# Patient Record
Sex: Male | Born: 2000 | Race: White | Hispanic: No | Marital: Single | Smoking: Never smoker
Health system: Southern US, Community
[De-identification: ages and names within clinical notes are randomized; demographics above are authoritative.]

## PROBLEM LIST (undated history)

## (undated) DIAGNOSIS — F909 Attention-deficit hyperactivity disorder, unspecified type: Secondary | ICD-10-CM

## (undated) DIAGNOSIS — R51 Headache: Secondary | ICD-10-CM

## (undated) DIAGNOSIS — J189 Pneumonia, unspecified organism: Secondary | ICD-10-CM

## (undated) DIAGNOSIS — L309 Dermatitis, unspecified: Secondary | ICD-10-CM

## (undated) DIAGNOSIS — J45909 Unspecified asthma, uncomplicated: Secondary | ICD-10-CM

## (undated) DIAGNOSIS — B974 Respiratory syncytial virus as the cause of diseases classified elsewhere: Secondary | ICD-10-CM

## (undated) DIAGNOSIS — D573 Sickle-cell trait: Secondary | ICD-10-CM

## (undated) DIAGNOSIS — R519 Headache, unspecified: Secondary | ICD-10-CM

## (undated) HISTORY — DX: Respiratory syncytial virus as the cause of diseases classified elsewhere: B97.4

## (undated) HISTORY — PX: TONSILLECTOMY: SUR1361

## (undated) HISTORY — PX: FINGER FRACTURE SURGERY: SHX638

---
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
## 2019-03-23 DIAGNOSIS — J45909 Unspecified asthma, uncomplicated: Secondary | ICD-10-CM | POA: Diagnosis not present

## 2019-03-23 DIAGNOSIS — J069 Acute upper respiratory infection, unspecified: Secondary | ICD-10-CM | POA: Diagnosis not present

## 2019-11-02 ENCOUNTER — Encounter (HOSPITAL_COMMUNITY): Payer: Self-pay

## 2019-11-02 ENCOUNTER — Ambulatory Visit (HOSPITAL_COMMUNITY)
Admission: EM | Admit: 2019-11-02 | Discharge: 2019-11-02 | Disposition: A | Discharge status: Home / Self Care | Payer: Medicaid Other | Attending: Internal Medicine | Admitting: Internal Medicine

## 2019-11-02 ENCOUNTER — Other Ambulatory Visit: Payer: Self-pay

## 2019-11-02 DIAGNOSIS — G43719 Chronic migraine without aura, intractable, without status migrainosus: Secondary | ICD-10-CM

## 2019-11-02 MED ORDER — IBUPROFEN 800 MG PO TABS: 800.0000 mg | ORAL_TABLET | Freq: Three times a day (TID) | ORAL | 0 refills | Status: DC

## 2019-11-02 NOTE — ED Triage Notes (Signed)
Patient presents to Urgent Care with complaints of headache since a week ago. Patient reports he has been taking advil for his pain.

## 2019-11-02 NOTE — Discharge Instructions (Addendum)
Push fluid Make a headache diary Follow up with PCP Return to clinic if symptom get worse

## 2019-11-02 NOTE — ED Provider Notes (Signed)
[facility]    CSN: [id number] Arrival date & time: 11/02/19  1238      History   Chief Complaint Chief Complaint  Patient presents with  . Headache    HPI Louis Bell is a 18 y.o. male.   The history is provided by the patient. No language interpreter was used.  Headache Pain location:  Generalized Quality: thrombing. Radiates to:  L neck and R neck (back of the neck) Severity currently:  5/10 Severity at highest:  7/10 Onset quality:  Gradual Duration:  1 week Timing:  Constant Progression:  Worsening Chronicity:  Chronic Similar to prior headaches: no   Context: not activity, not caffeine and not coughing   Relieved by:  NSAIDs Worsened by:  Sound and light Associated symptoms: no congestion, no cough, no diarrhea, no dizziness, no fatigue, no fever, no numbness, no sinus pressure, no sore throat, no URI and no weakness     Past Medical History:  Diagnosis Date  . ADHD (attention deficit hyperactivity disorder)    not on medication  . Asthma   . Eczema   . Headache   . Pneumonia   . RSV infection 2001  . Sickle cell trait [facility])     Patient Active Problem List   Diagnosis Date Noted  . Migraine without aura 01/16/2013  . Tension headache 01/16/2013  . Attention deficit disorder with hyperactivity(314.01) 01/16/2013    Past Surgical History:  Procedure Laterality Date  . ESOPHAGOGASTRODUODENOSCOPY N/A 02/05/2017   Procedure: ESOPHAGOGASTRODUODENOSCOPY (EGD);  Surgeon: Adelene Amas, MD;  Location: [facility] ENDOSCOPY;  Service: Gastroenterology;  Laterality: N/A;  . FINGER FRACTURE SURGERY     5th  . TONSILLECTOMY    . TONSILLECTOMY AND ADENOIDECTOMY  2011  . TYMPANOSTOMY  2011       Home Medications    Prior to Admission medications   Medication Sig Start Date End Date Taking? Authorizing Provider  dexmethylphenidate (FOCALIN XR) 15 MG 24 hr capsule Take 15 mg by mouth daily.    [provider]  ibuprofen (ADVIL) 800 MG  tablet Take 1 tablet (800 mg total) by mouth 3 (three) times daily. 11/02/19   Anyia Gierke, Zachery Dakins, FNP  lansoprazole (PREVACID) 30 MG capsule Take 1 capsule (30 mg total) by mouth daily. Patient not taking: Reported on 10/18/2017 01/23/17   Viviano Simas, NP  Magnesium Oxide 500 MG TABS Take by mouth.    [provider]  riboflavin (VITAMIN B-2) 100 MG TABS tablet Take 100 mg by mouth daily.    [provider]  sucralfate (CARAFATE) 1 g tablet Take 1 tablet (1 g total) by mouth 4 (four) times daily -  with meals and at bedtime. Patient not taking: Reported on 10/18/2017 01/23/17   Viviano Simas, NP    Family History Family History  Problem Relation Age of Onset  . Migraines Mother   . Depression Mother   . Hypertension Mother   . Alcohol abuse Father   . Hypertension Father   . Cancer Maternal Grandmother   . Migraines Sister   . Migraines Brother     Social History Social History   Tobacco Use  . Smoking status: Never Smoker  . Smokeless tobacco: Never Used  Substance Use Topics  . Alcohol use: No  . Drug use: No     Allergies   Patient has no known allergies.   Review of Systems Review of Systems  Constitutional: Negative for activity change, appetite change, chills, fatigue and fever.  HENT: Negative for congestion, sinus pressure, sinus pain and sore throat.   Respiratory: Negative for cough, chest tightness and shortness of breath.   Cardiovascular: Negative for chest pain and leg swelling.  Gastrointestinal: Negative for diarrhea.  Neurological: Positive for headaches. Negative for dizziness, tremors, facial asymmetry, speech difficulty, weakness, light-headedness and numbness.     Physical Exam Triage Vital Signs ED Triage Vitals  Enc Vitals Group     BP 11/02/19 1317 (!) 148/78     Pulse Rate 11/02/19 1317 79     Resp 11/02/19 1317 16     Temp 11/02/19 1317 98.1 F (36.7 C)     Temp Source 11/02/19 1317 Temporal     SpO2 11/02/19  1317 100 %     Weight --      Height --      Head Circumference --      Peak Flow --      Pain Score 11/02/19 1315 5     Pain Loc --      Pain Edu? --      Excl. in [place]? --    No data found.  Updated Vital Signs BP (!) 148/78 (BP Location: Right Arm)   Pulse 79   Temp 98.1 F (36.7 C) (Temporal)   Resp 16   SpO2 100%   Visual Acuity Right Eye Distance:   Left Eye Distance:   Bilateral Distance:    Right Eye Near:   Left Eye Near:    Bilateral Near:     Physical Exam Constitutional:      General: He is not in acute distress.    Appearance: He is well-developed and normal weight. He is not ill-appearing or toxic-appearing.  HENT:     Head: Normocephalic.  Cardiovascular:     Rate and Rhythm: Normal rate and regular rhythm.     Heart sounds: Normal heart sounds. No murmur.  Pulmonary:     Effort: Pulmonary effort is normal. No respiratory distress.     Breath sounds: Normal breath sounds. No stridor. No wheezing.  Neurological:     Mental Status: He is alert and oriented to person, place, and time.     Cranial Nerves: Cranial nerves are intact.     Sensory: Sensation is intact.     Motor: Motor function is intact.     Coordination: Coordination normal. Finger-Nose-Finger Test and Heel to Logan Test normal.     Gait: Gait is intact.      UC Treatments / Results  Labs (all labs ordered are listed, but only abnormal results are displayed) Labs Reviewed - No data to display  EKG   Radiology No results found.  Procedures Procedures (including critical care time)  Medications Ordered in UC Medications - No data to display  Initial Impression / Assessment and Plan / UC Course  I have reviewed the triage vital signs and the nursing notes.  Pertinent labs & imaging results that were available during my care of the patient were reviewed by me and considered in my medical decision making (see chart for details).   Ibuprofen 800 mg was prescribed. Patient was  advised to increase fluid intake and to follow up with PCP Final Clinical Impressions(s) / UC Diagnoses   Final diagnoses:  Intractable chronic migraine without aura and without status migrainosus     Discharge Instructions     Push fluid Make a headache diary Follow up with PCP Return to clinic if symptom get worse  ED Prescriptions    Medication Sig Dispense Auth. Provider   ibuprofen (ADVIL) 800 MG tablet Take 1 tablet (800 mg total) by mouth 3 (three) times daily. 42 tablet Townsend Cudworth, Zachery DakinsKomlanvi S, FNP     PDMP not reviewed this encounter.   Durward Parcelvegno, Sharla Tankard S, FNP 11/02/19 1353

## 2020-02-11 ENCOUNTER — Ambulatory Visit
Admission: EM | Admit: 2020-02-11 | Discharge: 2020-02-11 | Disposition: A | Discharge status: Home / Self Care | Payer: Medicaid Other | Attending: Physician Assistant | Admitting: Physician Assistant

## 2020-02-11 DIAGNOSIS — H18892 Other specified disorders of cornea, left eye: Secondary | ICD-10-CM

## 2020-02-11 MED ORDER — SYSTANE 0.4-0.3 % OP GEL: 1.0000 "application " | Freq: Every evening | OPHTHALMIC | 0 refills | Status: DC | PRN

## 2020-02-11 MED ORDER — OFLOXACIN 0.3 % OP SOLN: 1.0000 [drp] | Freq: Four times a day (QID) | OPHTHALMIC | 0 refills | Status: AC

## 2020-02-11 NOTE — ED Triage Notes (Signed)
Pt c/o lt eye irritation and redness after pressure washing brick/morder yesterday.

## 2020-02-11 NOTE — ED Provider Notes (Signed)
[name]    CSN: [id number] Arrival date & time: 02/11/20  1246      History   Chief Complaint Chief Complaint  Patient presents with  . Eye Pain    HPI Louis Bell is a 19 y.o. male.   19 year old male comes in for left eye irritation. He felt foreign body sensation to both eyes after washing brick/morder with pressure washing yesterday. He rinsed with tap water after and went to bed. Right eye symptoms has completely resolved. Left eye foreign body sensation has resolved, but now with eye irritation, redness. Had crusting in the morning. Denies vision changes. Mild photophobia. Denies contact lens/glasses use.      Past Medical History:  Diagnosis Date  . ADHD (attention deficit hyperactivity disorder)    not on medication  . Asthma   . Eczema   . Headache   . Pneumonia   . RSV infection 2001  . Sickle cell trait [facility])     Patient Active Problem List   Diagnosis Date Noted  . Migraine without aura 01/16/2013  . Tension headache 01/16/2013  . Attention deficit disorder with hyperactivity(314.01) 01/16/2013    Past Surgical History:  Procedure Laterality Date  . ESOPHAGOGASTRODUODENOSCOPY N/A 02/05/2017   Procedure: ESOPHAGOGASTRODUODENOSCOPY (EGD);  Surgeon: Adelene Amas, MD;  Location: [facility] ENDOSCOPY;  Service: Gastroenterology;  Laterality: N/A;  . FINGER FRACTURE SURGERY     5th  . TONSILLECTOMY    . TONSILLECTOMY AND ADENOIDECTOMY  2011  . TYMPANOSTOMY  2011       Home Medications    Prior to Admission medications   Medication Sig Start Date End Date Taking? Authorizing Provider  ofloxacin (OCUFLOX) 0.3 % ophthalmic solution Place 1 drop into the left eye 4 (four) times daily for 7 days. 02/11/20 02/18/20  Belinda Fisher, PA-C  Polyethyl Glycol-Propyl Glycol (SYSTANE) 0.4-0.3 % GEL ophthalmic gel Place 1 application into both eyes at bedtime as needed. 02/11/20   Belinda Fisher, PA-C    Family History Family History  Problem Relation Age of  Onset  . Migraines Mother   . Depression Mother   . Hypertension Mother   . Alcohol abuse Father   . Hypertension Father   . Cancer Maternal Grandmother   . Migraines Sister   . Migraines Brother     Social History Social History   Tobacco Use  . Smoking status: Never Smoker  . Smokeless tobacco: Never Used  Substance Use Topics  . Alcohol use: No  . Drug use: No     Allergies   Patient has no known allergies.   Review of Systems Review of Systems  Reason unable to perform ROS: See HPI as above.     Physical Exam Triage Vital Signs ED Triage Vitals  Enc Vitals Group     BP 02/11/20 1255 129/85     Pulse Rate 02/11/20 1255 86     Resp 02/11/20 1255 16     Temp 02/11/20 1255 98.3 F (36.8 C)     Temp Source 02/11/20 1255 Oral     SpO2 02/11/20 1255 98 %     Weight --      Height --      Head Circumference --      Peak Flow --      Pain Score 02/11/20 1256 2     Pain Loc --      Pain Edu? --      Excl. in [place]? --  No data found.  Updated Vital Signs BP 129/85 (BP Location: Left Arm)   Pulse 86   Temp 98.3 F (36.8 C) (Oral)   Resp 16   SpO2 98%   Visual Acuity Right Eye Distance: 2025 Left Eye Distance: 20/25 Bilateral Distance: 20/20  Right Eye Near:   Left Eye Near:    Bilateral Near:     Physical Exam Constitutional:      General: He is not in acute distress.    Appearance: He is well-developed.  HENT:     Head: Normocephalic and atraumatic.  Eyes:     General: Lids are normal. Lids are everted, no foreign bodies appreciated.     Extraocular Movements: Extraocular movements intact.     Pupils: Pupils are equal, round, and reactive to light.     Comments: No photophobia on exam.  Mild conjunctival injection to the lateral left eye.  No obvious defect/foreign body noted.  Fluorescein stain without uptake.  Musculoskeletal:     Cervical back: Normal range of motion and neck supple.  Skin:    General: Skin is warm and dry.   Neurological:     Mental Status: He is alert and oriented to person, place, and time.      UC Treatments / Results  Labs (all labs ordered are listed, but only abnormal results are displayed) Labs Reviewed - No data to display  EKG   Radiology No results found.  Procedures Procedures (including critical care time)  Medications Ordered in UC Medications - No data to display  Initial Impression / Assessment and Plan / UC Course  I have reviewed the triage vital signs and the nursing notes.  Pertinent labs & imaging results that were available during my care of the patient were reviewed by me and considered in my medical decision making (see chart for details).    Discussed irritation most likely from tapwater use during irrigation.  At this time, will start artificial tears for symptomatic management.  Rx of ofloxacin called into pharmacy, if patient continues to have symptoms, worsening conjunctival injection, crusting, can start ofloxacin as directed.  Return precautions given.  Patient expresses understanding and agrees to plan.  Final Clinical Impressions(s) / UC Diagnoses   Final diagnoses:  Corneal irritation of left eye   ED Prescriptions    Medication Sig Dispense Auth. Provider   Polyethyl Glycol-Propyl Glycol (SYSTANE) 0.4-0.3 % GEL ophthalmic gel Place 1 application into both eyes at bedtime as needed. 10 mL Dezra Mandella V, PA-C   ofloxacin (OCUFLOX) 0.3 % ophthalmic solution Place 1 drop into the left eye 4 (four) times daily for 7 days. 1.4 mL Ok Edwards, PA-C     PDMP not reviewed this encounter.   Ok Edwards, PA-C 02/11/20 1323

## 2020-02-11 NOTE — Discharge Instructions (Signed)
Start artificial tear gel once at night, more often if needed. If symptoms resolves in 2-3 days, do not have to fill the antibiotic eye drop. However, if starting to have more crusting to the eye, or continued symptoms after 3 day use of artificial tear, start ofloxacin as directed. Monitor for any worsening of symptoms, changes in vision, sensitivity to light, eye swelling, painful eye movement, follow up with ophthalmology for further evaluation.

## 2020-03-11 ENCOUNTER — Ambulatory Visit
Admission: EM | Admit: 2020-03-11 | Discharge: 2020-03-11 | Disposition: A | Discharge status: Home / Self Care | Payer: Medicaid Other

## 2020-07-01 ENCOUNTER — Other Ambulatory Visit: Payer: Self-pay

## 2020-07-01 ENCOUNTER — Ambulatory Visit
Admission: EM | Admit: 2020-07-01 | Discharge: 2020-07-01 | Disposition: A | Discharge status: Home / Self Care | Payer: Medicaid Other | Attending: Emergency Medicine | Admitting: Emergency Medicine

## 2020-07-01 DIAGNOSIS — K219 Gastro-esophageal reflux disease without esophagitis: Secondary | ICD-10-CM

## 2020-07-01 DIAGNOSIS — R1030 Lower abdominal pain, unspecified: Secondary | ICD-10-CM

## 2020-07-01 MED ORDER — NAPROXEN 500 MG PO TABS: 500.0000 mg | ORAL_TABLET | Freq: Two times a day (BID) | ORAL | 0 refills | Status: DC

## 2020-07-01 MED ORDER — PANTOPRAZOLE SODIUM 40 MG PO TBEC: 40.0000 mg | DELAYED_RELEASE_TABLET | Freq: Every day | ORAL | 0 refills | Status: DC

## 2020-07-01 NOTE — Discharge Instructions (Addendum)
Important to keep a log of your symptoms: When they start, what alleviates them, pain on a scale of 1-10. Bring your symptom log to your primary care for further evaluation. Take protonix once daily.

## 2020-07-01 NOTE — ED Triage Notes (Signed)
Pt c/o low mid abdominal pain x 2 weeks, last BM on Monday, states he normally has a BM daily. Has history of abdominal pain with abdominal migraine diagnosis, states this feels different. Pt states pain is 4/10 but "when I work it's like an 8."

## 2020-07-01 NOTE — ED Provider Notes (Signed)
[name]    CSN: [id number] Arrival date & time: 07/01/20  1454      History   Chief Complaint Chief Complaint  Patient presents with  . 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  Patient denies urinary frequency, urgency, burning sensation, hematuria, abdominal pain, back pain, fever, penile pain, swelling, irritation, testicular pain, swelling.  Last bowel movement yesterday: Micah Flesher twice which is more than normal.  Denies watery, fatty stools, blood or melena.  Does endorse history of abdominal migraine: No headache, visual changes, photophobia.  States abdominal migraines in the past have not had headaches.  States abdominal pain is mild currently.  Does admit to increased belching with some heartburn: Has not taken any for this.  Past Medical History:  Diagnosis Date  . ADHD (attention deficit hyperactivity disorder)    not on medication  . Asthma   . Eczema   . Headache   . Pneumonia   . RSV infection 2001  . Sickle cell trait [facility])     Patient Active Problem List   Diagnosis Date Noted  . Migraine without aura 01/16/2013  . Tension headache 01/16/2013  . Attention deficit disorder with hyperactivity(314.01) 01/16/2013    Past Surgical History:  Procedure Laterality Date  . ESOPHAGOGASTRODUODENOSCOPY N/A 02/05/2017   Procedure: ESOPHAGOGASTRODUODENOSCOPY (EGD);  Surgeon: Adelene Amas, MD;  Location: [facility] ENDOSCOPY;  Service: Gastroenterology;  Laterality: N/A;  . FINGER FRACTURE SURGERY     5th  . TONSILLECTOMY    . TONSILLECTOMY AND ADENOIDECTOMY  2011  . TYMPANOSTOMY  2011       Home Medications    Prior to Admission medications   Medication Sig Start Date End Date Taking? Authorizing Provider  naproxen (NAPROSYN) 500 MG tablet Take 1 tablet (500 mg total) by mouth 2 (two) times daily. 07/01/20   Hall-Potvin, [place], PA-C  pantoprazole (PROTONIX) 40 MG tablet  Take 1 tablet (40 mg total) by mouth daily. 07/01/20   Hall-Potvin, [place], PA-C    Family History Family History  Problem Relation Age of Onset  . Migraines Mother   . Depression Mother   . Hypertension Mother   . Alcohol abuse Father   . Hypertension Father   . Cancer Maternal Grandmother   . Migraines Sister   . Migraines Brother     Social History Social History   Tobacco Use  . Smoking status: Never Smoker  . Smokeless tobacco: Never Used  Vaping Use  . Vaping Use: Never used  Substance Use Topics  . Alcohol use: No  . Drug use: No     Allergies   Patient has no known allergies.   Review of Systems As per HPI   Physical Exam Triage Vital Signs ED Triage Vitals  Enc Vitals Group     BP      Pulse      Resp      Temp      Temp src      SpO2      Weight      Height      Head Circumference      Peak Flow      Pain Score      Pain Loc      Pain Edu?      Excl. in [place]?    No data found.  Updated Vital Signs BP (!) 150/83   Pulse 83  Temp 98.3 F (36.8 C)   Resp 18   SpO2 98%   Visual Acuity Right Eye Distance:   Left Eye Distance:   Bilateral Distance:    Right Eye Near:   Left Eye Near:    Bilateral Near:     Physical Exam Constitutional:      General: He is not in acute distress.    Appearance: He is well-developed. He is obese. He is not ill-appearing.  HENT:     Head: Normocephalic and atraumatic.     Mouth/Throat:     Pharynx: Oropharynx is clear.  Eyes:     General: No scleral icterus.    Pupils: Pupils are equal, round, and reactive to light.  Cardiovascular:     Rate and Rhythm: Normal rate.  Pulmonary:     Effort: Pulmonary effort is normal. No respiratory distress.     Breath sounds: No wheezing.  Abdominal:     General: Bowel sounds are normal. There is no distension or abdominal bruit.     Palpations: Abdomen is soft. There is no hepatomegaly or splenomegaly.     Tenderness: There is no abdominal tenderness.  There is no right CVA tenderness or left CVA tenderness. Negative signs include Murphy's sign, Rovsing's sign and McBurney's sign.     Hernia: No hernia is present.  Skin:    Capillary Refill: Capillary refill takes less than 2 seconds.     Coloration: Skin is not cyanotic, jaundiced or pale.     Findings: No rash.  Neurological:     Mental Status: He is alert and oriented to person, place, and time.      UC Treatments / Results  Labs (all labs ordered are listed, but only abnormal results are displayed) Labs Reviewed - No data to display  EKG   Radiology No results found.  Procedures Procedures (including critical care time)  Medications Ordered in UC Medications - No data to display  Initial Impression / Assessment and Plan / UC Course  I have reviewed the triage vital signs and the nursing notes.  Pertinent labs & imaging results that were available during my care of the patient were reviewed by me and considered in my medical decision making (see chart for details).     Patient appears well in office today.  Work note provided at patient's request.  Will start medication for GERD, monitor symptoms closely, and follow-up with PCP for further evaluation and management.  Discussed that GI referral may be needed in the future or neurology if these are truly abdominal migraines.  No in office treatment as patient is largely asymptomatic.  Return precautions discussed, pt verbalized understanding and is agreeable to plan. Final Clinical Impressions(s) / UC Diagnoses   Final diagnoses:  Lower abdominal pain  Gastroesophageal reflux disease, unspecified whether esophagitis present     Discharge Instructions     Important to keep a log of your symptoms: When they start, what alleviates them, pain on a scale of 1-10. Bring your symptom log to your primary care for further evaluation. Take protonix once daily.    ED Prescriptions    Medication Sig Dispense Auth.  Provider   naproxen (NAPROSYN) 500 MG tablet Take 1 tablet (500 mg total) by mouth 2 (two) times daily. 30 tablet Hall-Potvin, [place], PA-C   pantoprazole (PROTONIX) 40 MG tablet Take 1 tablet (40 mg total) by mouth daily. 30 tablet Hall-Potvin, [place], PA-C     PDMP not reviewed this encounter.   Hall-Potvin,  [place], PA-C 07/01/20 1528

## 2020-10-17 ENCOUNTER — Ambulatory Visit
Admission: EM | Admit: 2020-10-17 | Discharge: 2020-10-17 | Disposition: A | Discharge status: Home / Self Care | Payer: Medicaid Other | Attending: Emergency Medicine | Admitting: Emergency Medicine

## 2020-10-17 ENCOUNTER — Encounter: Payer: Self-pay | Admitting: Emergency Medicine

## 2020-10-17 ENCOUNTER — Other Ambulatory Visit: Payer: Self-pay

## 2020-10-17 DIAGNOSIS — R1084 Generalized abdominal pain: Secondary | ICD-10-CM | POA: Diagnosis not present

## 2020-10-17 MED ORDER — NAPROXEN 500 MG PO TABS: 500.0000 mg | ORAL_TABLET | Freq: Two times a day (BID) | ORAL | 0 refills | Status: DC

## 2020-10-17 MED ORDER — PANTOPRAZOLE SODIUM 40 MG PO TBEC: 40.0000 mg | DELAYED_RELEASE_TABLET | Freq: Every day | ORAL | 0 refills | Status: DC

## 2020-10-17 NOTE — ED Triage Notes (Signed)
Pt states he had abdominal pain on Wednesday with an emesis episode and it resolved and returned today. Pt states no nausea or emesis today but pain/cramps. Pt is aox4 and ambulatory.

## 2020-10-17 NOTE — Discharge Instructions (Addendum)
Take reflux medication once daily. Please take time to review food suggestion packet attached. Important follow-up with primary care/GI for further evaluation and management. Go to ER for vomiting, black stools, blood in stools, severe abdominal pain, fever. 

## 2020-10-17 NOTE — ED Provider Notes (Signed)
[name]    CSN: [id number] Arrival date & time: 10/17/20  1709      History   Chief Complaint Chief Complaint  Patient presents with  . Abdominal Pain    wednesday and then again today    HPI Louis Bell is a 19 y.o. male  Presenting for generalized abdominal pain since Wednesday.  Was seen for this by me on 07/01/2020: Please see those records attached to patient's file.  Was given PPI, NSAID with resolution.  States has been out of PPI for the last few months.  Denies increased use of NSAIDs, nausea, vomiting, fever.  States "it feels just like the last time ".  Requesting work note.   Past Medical History:  Diagnosis Date  . ADHD (attention deficit hyperactivity disorder)    not on medication  . Asthma   . Eczema   . Headache   . Pneumonia   . RSV infection 2001  . Sickle cell trait [facility])     Patient Active Problem List   Diagnosis Date Noted  . Migraine without aura 01/16/2013  . Tension headache 01/16/2013  . Attention deficit disorder with hyperactivity(314.01) 01/16/2013    Past Surgical History:  Procedure Laterality Date  . ESOPHAGOGASTRODUODENOSCOPY N/A 02/05/2017   Procedure: ESOPHAGOGASTRODUODENOSCOPY (EGD);  Surgeon: Adelene Amas, MD;  Location: [facility] ENDOSCOPY;  Service: Gastroenterology;  Laterality: N/A;  . FINGER FRACTURE SURGERY     5th  . TONSILLECTOMY    . TONSILLECTOMY AND ADENOIDECTOMY  2011  . TYMPANOSTOMY  2011       Home Medications    Prior to Admission medications   Medication Sig Start Date End Date Taking? Authorizing Provider  naproxen (NAPROSYN) 500 MG tablet Take 1 tablet (500 mg total) by mouth 2 (two) times daily. 10/17/20   Hall-Potvin, [place], PA-C  pantoprazole (PROTONIX) 40 MG tablet Take 1 tablet (40 mg total) by mouth daily. 10/17/20   Hall-Potvin, [place], PA-C    Family History Family History  Problem Relation Age of Onset  . Migraines Mother   . Depression Mother   . Hypertension Mother    . Alcohol abuse Father   . Hypertension Father   . Cancer Maternal Grandmother   . Migraines Sister   . Migraines Brother     Social History Social History   Tobacco Use  . Smoking status: Never Smoker  . Smokeless tobacco: Never Used  Vaping Use  . Vaping Use: Never used  Substance Use Topics  . Alcohol use: No  . Drug use: No     Allergies   Patient has no known allergies.   Review of Systems Review of Systems  Constitutional: Negative for fatigue and fever.  Respiratory: Negative for cough and shortness of breath.   Cardiovascular: Negative for chest pain and palpitations.  Gastrointestinal: Positive for abdominal pain. Negative for abdominal distention, blood in stool, constipation, diarrhea, nausea and vomiting.  Genitourinary: Negative for discharge and dysuria.  Musculoskeletal: Negative for arthralgias and myalgias.  Skin: Negative for rash and wound.  Neurological: Negative for speech difficulty and headaches.  All other systems reviewed and are negative.    Physical Exam Triage Vital Signs ED Triage Vitals [10/17/20 1823]  Enc Vitals Group     BP 135/89     Pulse Rate 75     Resp 20     Temp (!) 97.5 F (36.4 C)     Temp Source Oral     SpO2 98 %  Weight      Height      Head Circumference      Peak Flow      Pain Score      Pain Loc      Pain Edu?      Excl. in [place]?    No data found.  Updated Vital Signs BP 135/89 (BP Location: Right Arm)   Pulse 75   Temp (!) 97.5 F (36.4 C) (Oral)   Resp 20   SpO2 98%   Visual Acuity Right Eye Distance:   Left Eye Distance:   Bilateral Distance:    Right Eye Near:   Left Eye Near:    Bilateral Near:     Physical Exam Constitutional:      General: He is not in acute distress. HENT:     Head: Normocephalic and atraumatic.  Eyes:     General: No scleral icterus.    Pupils: Pupils are equal, round, and reactive to light.  Cardiovascular:     Rate and Rhythm: Normal rate.  Pulmonary:      Effort: Pulmonary effort is normal. No respiratory distress.     Breath sounds: No wheezing.  Abdominal:     General: Abdomen is flat. Bowel sounds are normal. There is no distension or abdominal bruit.     Palpations: Abdomen is soft. There is no hepatomegaly or splenomegaly.     Tenderness: There is no abdominal tenderness. There is no guarding or rebound. Negative signs include Murphy's sign, Rovsing's sign and McBurney's sign.     Hernia: No hernia is present.  Skin:    Coloration: Skin is not jaundiced or pale.  Neurological:     Mental Status: He is alert and oriented to person, place, and time.      UC Treatments / Results  Labs (all labs ordered are listed, but only abnormal results are displayed) Labs Reviewed - No data to display  EKG   Radiology No results found.  Procedures Procedures (including critical care time)  Medications Ordered in UC Medications - No data to display  Initial Impression / Assessment and Plan / UC Course  I have reviewed the triage vital signs and the nursing notes.  Pertinent labs & imaging results that were available during my care of the patient were reviewed by me and considered in my medical decision making (see chart for details).     Exam benign.  Will treat pt as below.  Again encouraged pt to keep sx log and f/u w/ GI for persistent/recurrent sx as he did not since last time.  Work note provided.  Return precautions discussed, pt verbalized understanding and is agreeable to plan. Final Clinical Impressions(s) / UC Diagnoses   Final diagnoses:  Generalized abdominal pain     Discharge Instructions     Take reflux medication once daily. Please take time to review food suggestion packet attached. Important follow-up with primary care/GI for further evaluation and management. Go to ER for vomiting, black stools, blood in stools, severe abdominal pain, fever.    ED Prescriptions    Medication Sig Dispense Auth.  Provider   pantoprazole (PROTONIX) 40 MG tablet Take 1 tablet (40 mg total) by mouth daily. 60 tablet Hall-Potvin, [place], PA-C   naproxen (NAPROSYN) 500 MG tablet Take 1 tablet (500 mg total) by mouth 2 (two) times daily. 30 tablet Hall-Potvin, [place], PA-C     PDMP not reviewed this encounter.   Odette Fraction [place], [place] 10/17/20 1930

## 2022-10-23 ENCOUNTER — Ambulatory Visit
Admission: EM | Admit: 2022-10-23 | Discharge: 2022-10-23 | Disposition: A | Discharge status: Home / Self Care | Payer: 59 | Attending: Physician Assistant | Admitting: Physician Assistant

## 2022-10-23 DIAGNOSIS — J019 Acute sinusitis, unspecified: Secondary | ICD-10-CM | POA: Diagnosis not present

## 2022-10-23 DIAGNOSIS — H1033 Unspecified acute conjunctivitis, bilateral: Secondary | ICD-10-CM | POA: Diagnosis not present

## 2022-10-23 MED ORDER — AMOXICILLIN-POT CLAVULANATE 875-125 MG PO TABS: 1.0000 | ORAL_TABLET | Freq: Two times a day (BID) | ORAL | 0 refills | Status: DC

## 2022-10-23 MED ORDER — POLYMYXIN B-TRIMETHOPRIM 10000-0.1 UNIT/ML-% OP SOLN: 1.0000 [drp] | OPHTHALMIC | 0 refills | Status: AC

## 2022-10-23 NOTE — ED Triage Notes (Signed)
Pt presents to uc with co of eye swelling, drainage, nasal congestion for 2 weeks has been taking tylenol.

## 2022-10-23 NOTE — ED Provider Notes (Signed)
[name]    CSN: [id number] Arrival date & time: 10/23/22  1537      History   Chief Complaint Chief Complaint  Patient presents with   Nasal Congestion   Facial Swelling    HPI Louis Bell is a 21 y.o. male.   Patient here today for evaluation of bilateral eye irritation and swelling, nasal congestion and drainage, cough that started 2 weeks ago.  He states he has been taking Tylenol with mild relief.  He has not had fever.  He denies any vomiting or diarrhea.  He states he typically does not have allergies this time of year.  The history is provided by the patient.    Past Medical History:  Diagnosis Date   ADHD (attention deficit hyperactivity disorder)    not on medication   Asthma    Eczema    Headache    Pneumonia    RSV infection 2001   Sickle cell trait [facility])     Patient Active Problem List   Diagnosis Date Noted   Migraine without aura 01/16/2013   Tension headache 01/16/2013   Attention deficit disorder with hyperactivity(314.01) 01/16/2013    Past Surgical History:  Procedure Laterality Date   ESOPHAGOGASTRODUODENOSCOPY N/A 02/05/2017   Procedure: ESOPHAGOGASTRODUODENOSCOPY (EGD);  Surgeon: Joycelyn Rua, MD;  Location: [place];  Service: Gastroenterology;  Laterality: N/A;   FINGER FRACTURE SURGERY     5th   TONSILLECTOMY     TONSILLECTOMY AND ADENOIDECTOMY  2011   TYMPANOSTOMY  2011       Home Medications    Prior to Admission medications   Medication Sig Start Date End Date Taking? Authorizing Provider  amoxicillin-clavulanate (AUGMENTIN) 875-125 MG tablet Take 1 tablet by mouth every 12 (twelve) hours. 10/23/22  Yes Francene Finders, PA-C  trimethoprim-polymyxin b (POLYTRIM) ophthalmic solution Place 1 drop into both eyes every 4 (four) hours for 7 days. 10/23/22 10/30/22 Yes Francene Finders, PA-C  naproxen (NAPROSYN) 500 MG tablet Take 1 tablet (500 mg total) by mouth 2 (two) times daily. 10/17/20   Hall-Potvin,  [place], PA-C  pantoprazole (PROTONIX) 40 MG tablet Take 1 tablet (40 mg total) by mouth daily. 10/17/20   Hall-Potvin, [place], PA-C    Family History Family History  Problem Relation Age of Onset   Migraines Mother    Depression Mother    Hypertension Mother    Alcohol abuse Father    Hypertension Father    Cancer Maternal Grandmother    Migraines Sister    Migraines Brother     Social History Social History   Tobacco Use   Smoking status: Never   Smokeless tobacco: Never  Vaping Use   Vaping Use: Never used  Substance Use Topics   Alcohol use: No   Drug use: No     Allergies   Patient has no known allergies.   Review of Systems Review of Systems  Constitutional:  Negative for chills and fever.  HENT:  Positive for congestion. Negative for ear pain and sore throat.   Eyes:  Positive for redness. Negative for discharge.  Respiratory:  Positive for cough. Negative for shortness of breath.   Gastrointestinal:  Negative for abdominal pain, nausea and vomiting.     Physical Exam Triage Vital Signs ED Triage Vitals  Enc Vitals Group     BP      Pulse      Resp      Temp      Temp src  SpO2      Weight      Height      Head Circumference      Peak Flow      Pain Score      Pain Loc      Pain Edu?      Excl. in [place]?    No data found.  Updated Vital Signs BP 131/86   Pulse 84   Temp 97.8 F (36.6 C)   Resp (!) 98   SpO2 98%      Physical Exam Vitals and nursing note reviewed.  Constitutional:      General: He is not in acute distress.    Appearance: Normal appearance. He is not ill-appearing.  HENT:     Head: Normocephalic and atraumatic.     Nose: Congestion present.     Mouth/Throat:     Mouth: Mucous membranes are moist.     Pharynx: Oropharynx is clear. No oropharyngeal exudate or posterior oropharyngeal erythema.  Eyes:     Comments: Bilateral conjunctiva mildly injected  Cardiovascular:     Rate and Rhythm: Normal rate and  regular rhythm.     Heart sounds: Normal heart sounds. No murmur heard. Pulmonary:     Effort: Pulmonary effort is normal. No respiratory distress.     Breath sounds: Normal breath sounds. No wheezing, rhonchi or rales.  Skin:    General: Skin is warm and dry.  Neurological:     Mental Status: He is alert.  Psychiatric:        Mood and Affect: Mood normal.        Thought Content: Thought content normal.      UC Treatments / Results  Labs (all labs ordered are listed, but only abnormal results are displayed) Labs Reviewed - No data to display  EKG   Radiology No results found.  Procedures Procedures (including critical care time)  Medications Ordered in UC Medications - No data to display  Initial Impression / Assessment and Plan / UC Course  I have reviewed the triage vital signs and the nursing notes.  Pertinent labs & imaging results that were available during my care of the patient were reviewed by me and considered in my medical decision making (see chart for details).    Will treat to cover sinusitis with augmentin and conjunctivitis with polytrim drops. Encouraged follow up if no gradual improvement or with any further concerns.   Final Clinical Impressions(s) / UC Diagnoses   Final diagnoses:  Acute sinusitis, recurrence not specified, unspecified location  Acute conjunctivitis of both eyes, unspecified acute conjunctivitis type   Discharge Instructions   None    ED Prescriptions     Medication Sig Dispense Auth. Provider   amoxicillin-clavulanate (AUGMENTIN) 875-125 MG tablet Take 1 tablet by mouth every 12 (twelve) hours. 14 tablet Ewell Poe F, PA-C   trimethoprim-polymyxin b (POLYTRIM) ophthalmic solution Place 1 drop into both eyes every 4 (four) hours for 7 days. 10 mL Francene Finders, PA-C      PDMP not reviewed this encounter.   Francene Finders, PA-C 10/23/22 1612

## 2022-12-06 ENCOUNTER — Other Ambulatory Visit: Payer: Self-pay

## 2022-12-06 ENCOUNTER — Ambulatory Visit
Admission: EM | Admit: 2022-12-06 | Discharge: 2022-12-06 | Disposition: A | Discharge status: Home / Self Care | Payer: 59 | Attending: Family Medicine | Admitting: Family Medicine

## 2022-12-06 ENCOUNTER — Encounter: Payer: Self-pay | Admitting: Emergency Medicine

## 2022-12-06 DIAGNOSIS — J4521 Mild intermittent asthma with (acute) exacerbation: Secondary | ICD-10-CM

## 2022-12-06 DIAGNOSIS — J069 Acute upper respiratory infection, unspecified: Secondary | ICD-10-CM

## 2022-12-06 DIAGNOSIS — Z20828 Contact with and (suspected) exposure to other viral communicable diseases: Secondary | ICD-10-CM

## 2022-12-06 MED ORDER — OSELTAMIVIR PHOSPHATE 75 MG PO CAPS: 75.0000 mg | ORAL_CAPSULE | Freq: Two times a day (BID) | ORAL | 0 refills | Status: AC

## 2022-12-06 MED ORDER — ALBUTEROL SULFATE HFA 108 (90 BASE) MCG/ACT IN AERS: 1.0000 | INHALATION_SPRAY | Freq: Four times a day (QID) | RESPIRATORY_TRACT | 1 refills | Status: DC | PRN

## 2022-12-06 NOTE — Discharge Instructions (Signed)
Caring for yourself: Get plenty of rest. Drink plenty of fluids, enough so that your urine is light yellow or clear like water. If you have kidney, heart, or liver disease and have to limit fluids, talk with your doctor before you increase the amount of fluids you drink. Take an over-the-counter pain medicine if needed, such as acetaminophen (Tylenol), ibuprofen (Advil, Motrin), or naproxen (Aleve), to relieve fever, headache, and muscle aches. Read and follow all instructions on the label. No one younger than 20 should take aspirin. It has been linked to Reye syndrome, a serious illness. Before you use over the counter cough and cold medicines, check the label. These medicines may not be safe for children younger than age 6 or for people with certain health problems. If the skin around your nose and lips becomes sore, put some petroleum jelly on the area.  Avoid spreading the flu: Wash your hands regularly, and keep your hands away from your face.  Stay home from school, work, and other public places until you are feeling better and your fever has been gone for at least 24 hours. The fever needs to have gone away on its own without the help of medicine.  

## 2022-12-06 NOTE — ED Triage Notes (Signed)
Pt here for cough and congestion x 2 days 

## 2022-12-06 NOTE — ED Provider Notes (Signed)
[facility]   [id number] 12/06/22 Arrival Time: 1647  ASSESSMENT & PLAN:  1. Viral URI with cough   2. Exposure to influenza   3. Mild intermittent asthma with acute exacerbation    No respiratory distress. Discussed typical duration of likely viral illness. Declines Rx cough medication. Work note provided. OTC symptom care as needed.  Meds ordered this encounter  Medications   oseltamivir (TAMIFLU) 75 MG capsule    Sig: Take 1 capsule (75 mg total) by mouth 2 (two) times daily for 5 days.    Dispense:  10 capsule    Refill:  0   albuterol (VENTOLIN HFA) 108 (90 Base) MCG/ACT inhaler    Sig: Inhale 1-2 puffs into the lungs every 6 (six) hours as needed for wheezing or shortness of breath.    Dispense:  1 each    Refill:  1       Follow-up Information     [facility] .   Specialty: Urgent Care Why: If worsening or failing to improve as anticipated. Contact information: [address] [id number] mc [place] [address] [phone]                Reviewed expectations re: course of current medical issues. Questions answered. Outlined signs and symptoms indicating need for more acute intervention. Understanding verbalized. After Visit Summary given.   SUBJECTIVE: History from: Patient. Louis Bell is a 21 y.o. male. Reports: cough and congestion x 2 days; abrupt onset; brother with + flu test this week. Denies: fever and difficulty breathing. Normal PO intake without n/v/d. Is wheezing at times; asthma history. No CP/SOB.  OBJECTIVE:  Vitals:   12/06/22 1852  BP: (!) 142/84  Pulse: 85  Resp: 18  Temp: 98.7 F (37.1 C)  TempSrc: Oral  SpO2: 97%    General appearance: alert; no distress Eyes: PERRLA; EOMI; conjunctiva normal HENT: [place]; AT; with nasal congestion Neck: supple  Lungs: speaks full sentences without difficulty; unlabored; mild bilat exp wheeze Extremities: no  edema Skin: warm and dry Neurologic: normal gait Psychological: alert and cooperative; normal mood and affect   No Known Allergies  Past Medical History:  Diagnosis Date   ADHD (attention deficit hyperactivity disorder)    not on medication   Asthma    Eczema    Headache    Pneumonia    RSV infection 2001   Sickle cell trait (HCC)    Social History   Socioeconomic History   Marital status: Single    Spouse name: Not on file   Number of children: Not on file   Years of education: Not on file   Highest education level: Not on file  Occupational History   Not on file  Tobacco Use   Smoking status: Never   Smokeless tobacco: Never  Vaping Use   Vaping Use: Never used  Substance and Sexual Activity   Alcohol use: No   Drug use: No   Sexual activity: Yes  Other Topics Concern   Not on file  Social History Narrative   Rayner is a 12th grade student, he attends [employer]. Lives with Mom only and has 5 siblings. He enjoys baseball, school, and hanging with his friends.   Social Determinants of Health   Financial Resource Strain: Not on file  Food Insecurity: Not on file  Transportation Needs: Not on file  Physical Activity: Not on file  Stress: Not on file  Social Connections:  Not on file  Intimate Partner Violence: Not on file   Family History  Problem Relation Age of Onset   Migraines Mother    Depression Mother    Hypertension Mother    Alcohol abuse Father    Hypertension Father    Cancer Maternal Grandmother    Migraines Sister    Migraines Brother    Past Surgical History:  Procedure Laterality Date   ESOPHAGOGASTRODUODENOSCOPY N/A 02/05/2017   Procedure: ESOPHAGOGASTRODUODENOSCOPY (EGD);  Surgeon: Adelene Amas, MD;  Location: [facility] ENDOSCOPY;  Service: Gastroenterology;  Laterality: N/A;   FINGER FRACTURE SURGERY     5th   TONSILLECTOMY     TONSILLECTOMY AND ADENOIDECTOMY  2011   TYMPANOSTOMY  2011     Mardella Layman, MD 12/06/22  Barry Brunner

## 2023-10-08 ENCOUNTER — Ambulatory Visit
Admission: RE | Admit: 2023-10-08 | Discharge: 2023-10-08 | Disposition: A | Discharge status: Home / Self Care | Payer: Self-pay | Source: Ambulatory Visit | Attending: Physician Assistant | Admitting: Physician Assistant

## 2023-10-08 VITALS — BP 129/93 | HR 102 | Temp 97.9°F | Resp 20 | Ht 71.0 in | Wt 260.0 lb

## 2023-10-08 DIAGNOSIS — J069 Acute upper respiratory infection, unspecified: Secondary | ICD-10-CM | POA: Insufficient documentation

## 2023-10-08 DIAGNOSIS — Z1152 Encounter for screening for COVID-19: Secondary | ICD-10-CM | POA: Insufficient documentation

## 2023-10-08 LAB — POCT RAPID STREP A (OFFICE): Rapid Strep A Screen: NEGATIVE

## 2023-10-08 NOTE — ED Triage Notes (Signed)
Patient presents with sore throat, congestion, runny nose and headache x day 3. Treated with Dayquil/ Nyquil and Ibuprofen, states cold medicine only an hour, then symptoms resumed.

## 2023-10-08 NOTE — ED Provider Notes (Signed)
[name]    CSN: [id number] Arrival date & time: 10/08/23  1444      History   Chief Complaint No chief complaint on file.   HPI Louis Bell is a 22 y.o. male.   Patient here today for evaluation of sore throat, congestion, runny nose and headache she has had for 3 days.  He has taken DayQuil NyQuil and ibuprofen without resolution.  He denies any vomiting or diarrhea.  The history is provided by the patient.    Past Medical History:  Diagnosis Date   ADHD (attention deficit hyperactivity disorder)    not on medication   Asthma    Eczema    Headache    Pneumonia    RSV infection 2001   Sickle cell trait [facility])     Patient Active Problem List   Diagnosis Date Noted   Migraine without aura 01/16/2013   Tension headache 01/16/2013   Attention deficit hyperactivity disorder (ADHD) 01/16/2013    Past Surgical History:  Procedure Laterality Date   ESOPHAGOGASTRODUODENOSCOPY N/A 02/05/2017   Procedure: ESOPHAGOGASTRODUODENOSCOPY (EGD);  Surgeon: Adelene Amas, MD;  Location: [facility] END[facility]COPY;  Service: Gastroenterology;  Laterality: N/A;   FINGER FRACTURE SURGERY     5th   TONSILLECTOMY     TONSILLECTOMY AND ADENOIDECTOMY  2011   TYMPANOSTOMY  2011       Home Medications    Prior to Admission medications   Medication Sig Start Date End Date Taking? Authorizing Provider  albuterol (VENTOLIN HFA) 108 (90 Base) MCG/ACT inhaler Inhale 1-2 puffs into the lungs every 6 (six) hours as needed for wheezing or shortness of breath. 12/06/22   Mardella Layman, MD  amoxicillin-clavulanate (AUGMENTIN) 875-125 MG tablet Take 1 tablet by mouth every 12 (twelve) hours. Patient not taking: Reported on 12/06/2022 10/23/22   Tomi Bamberger, PA-C  naproxen (NAPROSYN) 500 MG tablet Take 1 tablet (500 mg total) by mouth 2 (two) times daily. 10/17/20   Hall-Potvin, [place], PA-C  pantoprazole (PROTONIX) 40 MG tablet Take 1 tablet (40 mg total) by mouth daily. 10/17/20    Hall-Potvin, [place], PA-C    Family History Family History  Problem Relation Age of Onset   Migraines Mother    Depression Mother    Hypertension Mother    Alcohol abuse Father    Hypertension Father    Cancer Maternal Grandmother    Migraines Sister    Migraines Brother     Social History Social History   Tobacco Use   Smoking status: Never   Smokeless tobacco: Never  Vaping Use   Vaping status: Never Used  Substance Use Topics   Alcohol use: No   Drug use: No     Allergies   Patient has no known allergies.   Review of Systems Review of Systems  Constitutional:  Negative for chills and fever.  HENT:  Positive for congestion and sore throat. Negative for ear pain.   Eyes:  Negative for discharge and redness.  Respiratory:  Positive for cough. Negative for shortness of breath.   Gastrointestinal:  Negative for abdominal pain, diarrhea, nausea and vomiting.     Physical Exam Triage Vital Signs ED Triage Vitals  Encounter Vitals Group     BP 10/08/23 1526 (!) 129/93     Systolic BP Percentile --      Diastolic BP Percentile --      Pulse Rate 10/08/23 1526 (!) 102     Resp 10/08/23 1526 20  Temp 10/08/23 1526 97.9 F (36.6 C)     Temp Source 10/08/23 1526 Oral     SpO2 10/08/23 1526 98 %     Weight 10/08/23 1525 260 lb (117.9 kg)     Height 10/08/23 1525 5\' 11"  (1.803 m)     Head Circumference --      Peak Flow --      Pain Score 10/08/23 1525 4     Pain Loc --      Pain Education --      Exclude from Growth Chart --    No data found.  Updated Vital Signs BP (!) 129/93 (BP Location: Left Arm)   Pulse (!) 102   Temp 97.9 F (36.6 C) (Oral)   Resp 20   Ht 5\' 11"  (1.803 m)   Wt 260 lb (117.9 kg)   SpO2 98%   BMI 36.26 kg/m      Physical Exam Vitals and nursing note reviewed.  Constitutional:      General: He is not in acute distress.    Appearance: He is well-developed. He is not ill-appearing.  HENT:     Head: Normocephalic and  atraumatic.     Nose: Congestion present.     Mouth/Throat:     Mouth: Mucous membranes are moist.     Pharynx: Posterior oropharyngeal erythema present. No oropharyngeal exudate.     Tonsils: 0 on the right. 0 on the left.  Eyes:     Conjunctiva/sclera: Conjunctivae normal.  Cardiovascular:     Rate and Rhythm: Normal rate and regular rhythm.     Heart sounds: Normal heart sounds. No murmur heard. Pulmonary:     Effort: Pulmonary effort is normal. No respiratory distress.     Breath sounds: Normal breath sounds. No wheezing, rhonchi or rales.  Skin:    General: Skin is warm and dry.  Neurological:     Mental Status: He is alert.  Psychiatric:        Mood and Affect: Mood normal.        Behavior: Behavior normal.      UC Treatments / Results  Labs (all labs ordered are listed, but only abnormal results are displayed) Labs Reviewed  POCT RAPID STREP A (OFFICE) - Normal  SARS CORONAVIRUS 2 (TAT 6-24 HRS)    EKG   Radiology No results found.  Procedures Procedures (including critical care time)  Medications Ordered in UC Medications - No data to display  Initial Impression / Assessment and Plan / UC Course  I have reviewed the triage vital signs and the nursing notes.  Pertinent labs & imaging results that were available during my care of the patient were reviewed by me and considered in my medical decision making (see chart for details).   Rapid strep test negative.  Will order COVID screening I discussed suspected viral etiology of symptoms.  Recommended symptomatic treatment, increase fluids and rest with follow-up if no gradual improvement with any further concerns.   Final Clinical Impressions(s) / UC Diagnoses   Final diagnoses:  Acute upper respiratory infection   Discharge Instructions   None    ED Prescriptions   None    PDMP not reviewed this encounter.   Tomi Bamberger, PA-C 10/21/23 1048

## 2023-10-09 LAB — SARS CORONAVIRUS 2 (TAT 6-24 HRS): SARS Coronavirus 2: NEGATIVE

## 2024-01-20 ENCOUNTER — Ambulatory Visit
Admission: EM | Admit: 2024-01-20 | Discharge: 2024-01-20 | Disposition: A | Discharge status: Home / Self Care | Payer: BLUE CROSS/BLUE SHIELD

## 2024-01-20 DIAGNOSIS — K5900 Constipation, unspecified: Secondary | ICD-10-CM | POA: Diagnosis not present

## 2024-01-20 NOTE — ED Provider Notes (Signed)
[name]    CSN: [id number] Arrival date & time: 01/20/24  0950      History   Chief Complaint Chief Complaint  Patient presents with   Abdominal Pain   Emesis    HPI Louis Bell is a 23 y.o. male.   Patient complains of abdominal pain.  Patient reports he has not had a bowel movement in the past 4 days.  Patient reports that he has a history of constipation.  He has not taken any medication for constipation.  Patient states that he has not had a fever or chills.  Patient states that he has had abdominal migraines in the past with severe cramping.  Patient states that this pain is mild.  Patient denies any nausea or vomiting he denies any diarrhea.   Abdominal Pain Associated symptoms: vomiting   Emesis Associated symptoms: abdominal pain     Past Medical History:  Diagnosis Date   ADHD (attention deficit hyperactivity disorder)    not on medication   Asthma    Eczema    Headache    Pneumonia    RSV infection 09-Sep-2000   Sickle cell trait [facility])     Patient Active Problem List   Diagnosis Date Noted   Migraine without aura 01/16/2013   Tension headache 01/16/2013   Attention deficit hyperactivity disorder (ADHD) 01/16/2013    Past Surgical History:  Procedure Laterality Date   ESOPHAGOGASTRODUODENOSCOPY N/A 02/05/2017   Procedure: ESOPHAGOGASTRODUODENOSCOPY (EGD);  Surgeon: Adelene Amas, MD;  Location: [facility] ENDOSCOPY;  Service: Gastroenterology;  Laterality: N/A;   FINGER FRACTURE SURGERY     5th   TONSILLECTOMY     TONSILLECTOMY AND ADENOIDECTOMY  2011   TYMPANOSTOMY  2011       Home Medications    Prior to Admission medications   Not on File    Family History Family History  Problem Relation Age of Onset   Migraines Mother    Depression Mother    Hypertension Mother    Alcohol abuse Father    Hypertension Father    Cancer Maternal Grandmother    Migraines Sister    Migraines Brother     Social History Social History    Tobacco Use   Smoking status: Never   Smokeless tobacco: Never  Vaping Use   Vaping status: Some Days  Substance Use Topics   Alcohol use: No   Drug use: No     Allergies   Patient has no known allergies.   Review of Systems Review of Systems  Gastrointestinal:  Positive for abdominal pain and vomiting.  All other systems reviewed and are negative.    Physical Exam Triage Vital Signs ED Triage Vitals  Encounter Vitals Group     BP 01/20/24 1509 (!) 138/94     Systolic BP Percentile --      Diastolic BP Percentile --      Pulse Rate 01/20/24 1509 98     Resp 01/20/24 1509 18     Temp 01/20/24 1509 97.9 F (36.6 C)     Temp Source 01/20/24 1509 Oral     SpO2 01/20/24 1509 95 %     Weight --      Height --      Head Circumference --      Peak Flow --      Pain Score 01/20/24 1505 2     Pain Loc --      Pain Education --      Exclude  from Growth Chart --    No data found.  Updated Vital Signs BP (!) 138/94 (BP Location: Right Arm)   Pulse 98   Temp 97.9 F (36.6 C) (Oral)   Resp 18   SpO2 95%   Visual Acuity Right Eye Distance:   Left Eye Distance:   Bilateral Distance:    Right Eye Near:   Left Eye Near:    Bilateral Near:     Physical Exam Vitals and nursing note reviewed.  Constitutional:      Appearance: He is well-developed.  HENT:     Head: Normocephalic.  Cardiovascular:     Rate and Rhythm: Normal rate.  Pulmonary:     Effort: Pulmonary effort is normal.  Abdominal:     General: Abdomen is flat. Bowel sounds are normal. There is no distension.     Palpations: Abdomen is soft.     Tenderness: There is abdominal tenderness.  Musculoskeletal:        General: Normal range of motion.     Cervical back: Normal range of motion.  Skin:    General: Skin is warm.  Neurological:     General: No focal deficit present.     Mental Status: He is alert and oriented to person, place, and time.      UC Treatments / Results  Labs (all  labs ordered are listed, but only abnormal results are displayed) Labs Reviewed - No data to display  EKG   Radiology No results found.  Procedures Procedures (including critical care time)  Medications Ordered in UC Medications - No data to display  Initial Impression / Assessment and Plan / UC Course  I have reviewed the triage vital signs and the nursing notes.  Pertinent labs & imaging results that were available during my care of the patient were reviewed by me and considered in my medical decision making (see chart for details).     Patient is counseled on constipation.  He is advised to go to the emergency department if symptoms worsen or change. Final Clinical Impressions(s) / UC Diagnoses   Final diagnoses:  Constipation, unspecified constipation type     Discharge Instructions      Mix 1 bottle of MiraLAX in TWO 32  ounce bottles of Gatorade.  Drink 8 ounces every hour until you have 2 good bowel movements.  Go to the Emergency department if symptoms worsen or change.    ED Prescriptions   None   An After Visit Summary was printed and given to the patient.     PDMP not reviewed this encounter.   Elson Areas, [place] 01/20/24 1726

## 2024-01-20 NOTE — ED Triage Notes (Signed)
Pt presents with intermittent abdominal pain x 2 days. Has not had BM in three days.  Attempted to have BM today but vomited.  States he does have abdominal migraines, going three days with no BM is abnormal. Has not taken anything to help with BM.

## 2024-01-20 NOTE — Discharge Instructions (Addendum)
Mix 1 bottle of MiraLAX in TWO 32  ounce bottles of Gatorade.  Drink 8 ounces every hour until you have 2 good bowel movements.  Go to the Emergency department if symptoms worsen or change.

## 2024-05-10 ENCOUNTER — Ambulatory Visit
Admission: EM | Admit: 2024-05-10 | Discharge: 2024-05-10 | Disposition: A | Discharge status: Home / Self Care | Attending: Nurse Practitioner | Admitting: Nurse Practitioner

## 2024-05-10 DIAGNOSIS — L0231 Cutaneous abscess of buttock: Secondary | ICD-10-CM | POA: Diagnosis not present

## 2024-05-10 MED ORDER — IBUPROFEN 800 MG PO TABS: 800.0000 mg | ORAL_TABLET | Freq: Three times a day (TID) | ORAL | 0 refills | Status: AC | PRN

## 2024-05-10 MED ORDER — SULFAMETHOXAZOLE-TRIMETHOPRIM 800-160 MG PO TABS: 1.0000 | ORAL_TABLET | Freq: Two times a day (BID) | ORAL | 0 refills | Status: AC

## 2024-05-10 NOTE — ED Triage Notes (Signed)
"  I have cyst on buttocks (crack above rectum) that I have had before". ? pilonidal cyst. "I have had it now for a week or so but started hurting this weekend". No fever. I do not have a PCP.

## 2024-05-10 NOTE — ED Provider Notes (Signed)
 [name]    CSN: [id number] Arrival date & time: 05/10/24  1107      History   Chief Complaint Chief Complaint  Patient presents with   Skin Problem    HPI Louis Bell is a 23 y.o. male.   Louis Bell is a 23 year old male presenting with a cyst located just above the gluteal cleft, present for approximately one week. He has a history of similar cysts, with at least two prior episodes, the most recent occurring in 2019. The current cyst began causing pain over the weekend, which marks a change from its initially painless state. Unlike previous episodes where the cysts spontaneously drained, this one has not produced any drainage. He denies fever or systemic symptoms. 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 He has not taken any pain medication due to the cyst's location and has made no attempts to drain or manipulate it. The patient is not diabetic and expresses hope that the cyst is ready to be drained, suggesting increasing discomfort and disruption to daily activity.  The following portions of the patient's history were reviewed and updated as appropriate: allergies, current medications, past family history, past medical history, past social history, past surgical history, and problem list.     Past Medical History:  Diagnosis Date   ADHD (attention deficit hyperactivity disorder)    not on medication   Asthma    Eczema    Headache    Pneumonia    RSV infection 2001   Sickle cell trait (HCC)     Patient Active Problem List   Diagnosis Date Noted   Migraine without aura 01/16/2013   Tension headache 01/16/2013   Attention deficit hyperactivity disorder (ADHD) 01/16/2013    Past Surgical History:  Procedure Laterality Date   ESOPHAGOGASTRODUODENOSCOPY N/A 02/05/2017   Procedure: ESOPHAGOGASTRODUODENOSCOPY (EGD);  Surgeon: Calvert Caul, MD;  Location: [facility] ENDOSCOPY;  Service:  Gastroenterology;  Laterality: N/A;   FINGER FRACTURE SURGERY     5th   TONSILLECTOMY     TONSILLECTOMY AND ADENOIDECTOMY  2011   TYMPANOSTOMY  2011       Home Medications    Prior to Admission medications   Medication Sig Start Date End Date Taking? Authorizing Provider  ibuprofen  (ADVIL ) 800 MG tablet Take 1 tablet (800 mg total) by mouth every 8 (eight) hours as needed (pain). 05/10/24  Yes Maryruth Sol, FNP  sulfamethoxazole-trimethoprim  (BACTRIM DS) 800-160 MG tablet Take 1 tablet by mouth 2 (two) times daily for 7 days. 05/10/24 05/17/24 Yes Maryruth Sol, FNP    Family History Family History  Problem Relation Age of Onset   Migraines Mother    Depression Mother    Hypertension Mother    Alcohol abuse Father    Hypertension Father    Cancer Maternal Grandmother    Migraines Sister    Migraines Brother     Social History Social History   Tobacco Use   Smoking status: Never   Smokeless tobacco: Never  Vaping Use   Vaping status: Former   Substances: Nicotine, Flavoring  Substance Use Topics   Alcohol use: No   Drug use: No     Allergies   Patient has no known allergies.   Review of Systems Review of Systems  Constitutional:  Negative for fever.  Skin:  Positive for wound.  All other systems reviewed and are negative.    Physical Exam Triage Vital Signs ED  Triage Vitals  Encounter Vitals Group     BP 05/10/24 1124 124/85     Systolic BP Percentile --      Diastolic BP Percentile --      Pulse Rate 05/10/24 1124 95     Resp 05/10/24 1124 20     Temp 05/10/24 1124 98.4 F (36.9 C)     Temp Source 05/10/24 1124 Oral     SpO2 05/10/24 1124 96 %     Weight 05/10/24 1122 270 lb (122.5 kg)     Height 05/10/24 1122 5\' 11"  (1.803 m)     Head Circumference --      Peak Flow --      Pain Score 05/10/24 1115 7     Pain Loc --      Pain Education --      Exclude from Growth Chart --    No data found.  Updated Vital Signs BP 124/85 (BP  Location: Left Arm)   Pulse 95   Temp 98.4 F (36.9 C) (Oral)   Resp 20   Ht 5\' 11"  (1.803 m)   Wt 270 lb (122.5 kg)   SpO2 96%   BMI 37.66 kg/m   Visual Acuity Right Eye Distance:   Left Eye Distance:   Bilateral Distance:    Right Eye Near:   Left Eye Near:    Bilateral Near:     Physical Exam Vitals and nursing note reviewed.  Constitutional:      General: He is awake. He is not in acute distress.    Appearance: Normal appearance. He is well-developed. He is not ill-appearing or toxic-appearing.  HENT:     Head: Normocephalic.     Mouth/Throat:     Mouth: Mucous membranes are moist.  Eyes:     Conjunctiva/sclera: Conjunctivae normal.  Cardiovascular:     Rate and Rhythm: Normal rate and regular rhythm.     Heart sounds: Normal heart sounds.  Pulmonary:     Effort: Pulmonary effort is normal.     Breath sounds: Normal breath sounds.  Musculoskeletal:        General: Normal range of motion.  Skin:    General: Skin is warm and dry.     Findings: Abscess present.          Comments: There is approximately a 3 x 3 cm area of firm erythema noted anterior to the gluteal cleft. No underlying fluctuance is appreciated, and there is no significant surrounding erythema.   Neurological:     General: No focal deficit present.     Mental Status: He is alert and oriented to person, place, and time.  Psychiatric:        Behavior: Behavior is cooperative.      UC Treatments / Results  Labs (all labs ordered are listed, but only abnormal results are displayed) Labs Reviewed - No data to display  EKG   Radiology No results found.  Procedures Procedures (including critical care time)  Medications Ordered in UC Medications - No data to display  Initial Impression / Assessment and Plan / UC Course  I have reviewed the triage vital signs and the nursing notes.  Pertinent labs & imaging results that were available during my care of the patient were reviewed by me  and considered in my medical decision making (see chart for details).     Patient presents with a cyst located above the gluteal cleft, present for approximately one week, which became painful over the  weekend but has not drained. On exam, the patient is afebrile and nontoxic. The cyst is firm, mildly erythematous, and tender, measuring approximately 3x3 cm, without fluctuance and not currently amenable to incision and drainage. Bactrim DS was prescribed twice daily for 7 days. The patient declined an in-clinic Rocephin injection. Warm moist compresses and sitz baths with Epsom salt in hot water were recommended to encourage drainage. The patient was instructed not to manipulate or mash the area. A follow-up appointment was scheduled in 2 days for reassessment. If the cyst drains spontaneously before then, the patient may choose whether to keep the appointment.  Final Clinical Impressions(s) / UC Diagnoses   Final diagnoses:  Abscess of buttock     Discharge Instructions      You were seen today for a painful cyst located just above the "butt crack" (gluteal cleft). On exam, the cyst is not yet ready to be drained. You were prescribed an antibiotic called Bactrim DS to take twice daily for 7 days to treat any underlying infection. To help reduce inflammation and encourage drainage, apply warm, moist compresses to the area several times a day and take sitz baths using hot water and Epsom salt. Do not attempt to squeeze, mash, or manipulate the cyst, as this can worsen irritation or lead to infection.  Follow-up in 2 days to reassess the area. If the cyst drains on its own before then and your symptoms improve, you may decide whether or not to keep the follow-up visit. If you develop worsening pain, redness, fever, or drainage with foul odor, seek medical care promptly.    ED Prescriptions     Medication Sig Dispense Auth. Provider   sulfamethoxazole-trimethoprim  (BACTRIM DS) 800-160 MG tablet  Take 1 tablet by mouth 2 (two) times daily for 7 days. 14 tablet Clare Casto, [place], FNP   ibuprofen  (ADVIL ) 800 MG tablet Take 1 tablet (800 mg total) by mouth every 8 (eight) hours as needed (pain). 21 tablet Maryruth Sol, FNP      PDMP not reviewed this encounter.   Maryruth Sol, [place] 05/10/24 1248

## 2024-05-10 NOTE — Discharge Instructions (Addendum)
 You were seen today for a painful cyst located just above the "butt crack" (gluteal cleft). On exam, the cyst is not yet ready to be drained. You were prescribed an antibiotic called Bactrim DS to take twice daily for 7 days to treat any underlying infection. To help reduce inflammation and encourage drainage, apply warm, moist compresses to the area several times a day and take sitz baths using hot water and Epsom salt. Do not attempt to squeeze, mash, or manipulate the cyst, as this can worsen irritation or lead to infection.  Follow-up in 2 days to reassess the area. If the cyst drains on its own before then and your symptoms improve, you may decide whether or not to keep the follow-up visit. If you develop worsening pain, redness, fever, or drainage with foul odor, seek medical care promptly.

## 2024-05-12 ENCOUNTER — Ambulatory Visit
Admission: RE | Admit: 2024-05-12 | Discharge: 2024-05-12 | Disposition: A | Discharge status: Home / Self Care | Source: Ambulatory Visit | Attending: Physician Assistant | Admitting: Physician Assistant

## 2024-05-12 VITALS — BP 137/86 | HR 74 | Temp 98.1°F | Resp 19 | Ht 71.0 in | Wt 270.0 lb

## 2024-05-12 DIAGNOSIS — L0501 Pilonidal cyst with abscess: Secondary | ICD-10-CM

## 2024-05-12 MED ORDER — AMOXICILLIN-POT CLAVULANATE 875-125 MG PO TABS: 1.0000 | ORAL_TABLET | Freq: Two times a day (BID) | ORAL | 0 refills | Status: AC

## 2024-05-12 NOTE — ED Triage Notes (Signed)
 Pt states that he is here to follow up on the cyst that is on his tailbone. X2 days  Pt states that the cyst has popped and is now draining.

## 2024-05-12 NOTE — Discharge Instructions (Signed)
 We were able to drain the abscess today.  Continue with your warm compresses.  Keep this covered as it will continue to drain.  As we discussed, this can come back so I do recommend following up with a surgeon.  Call them to schedule an appointment.  Continue the Bactrim DS that you are prescribed and add Augmentin  twice daily.  If anything changes and you have increasing drainage, fever, increasing pain, nausea, vomiting you need to be seen immediately.

## 2024-05-12 NOTE — ED Provider Notes (Signed)
 [name]    CSN: [id number] Arrival date & time: 05/12/24  1245      History   Chief Complaint Chief Complaint  Patient presents with   Abscess    HPI Louis Bell is a 23 y.o. male.   Patient presents today for reevaluation of pilonidal cyst.  He was seen on 05/10/2024 at which point he was started on Bactrim DS.  He reports that he has been doing warm compresses and taking ibuprofen  for pain and over the past 24 hours he has had some drainage prompting reevaluation.  Pain is currently rated 4 and a 0 to pain scale, described as aching, no aggravating relieving factors identified.  He denies any fever, nausea, vomiting.  Denies history of MRSA.  He has never seen a surgeon or had a pilonidal cyst removed.  He has had this 2 other times in the past with last episode over 2 years ago.  He denies additional antibiotics outside of what was prescribed at his last visit in the past 90 days.    Past Medical History:  Diagnosis Date   ADHD (attention deficit hyperactivity disorder)    not on medication   Asthma    Eczema    Headache    Pneumonia    RSV infection 2001   Sickle cell trait [facility])     Patient Active Problem List   Diagnosis Date Noted   Migraine without aura 01/16/2013   Tension headache 01/16/2013   Attention deficit hyperactivity disorder (ADHD) 01/16/2013    Past Surgical History:  Procedure Laterality Date   ESOPHAGOGASTRODUODENOSCOPY N/A 02/05/2017   Procedure: ESOPHAGOGASTRODUODENOSCOPY (EGD);  Surgeon: Calvert Caul, MD;  Location: [facility] ENDOSCOPY;  Service: Gastroenterology;  Laterality: N/A;   FINGER FRACTURE SURGERY     5th   TONSILLECTOMY     TONSILLECTOMY AND ADENOIDECTOMY  2011   TYMPANOSTOMY  2011       Home Medications    Prior to Admission medications   Medication Sig Start Date End Date Taking? Authorizing Provider  amoxicillin -clavulanate (AUGMENTIN ) 875-125 MG tablet Take 1 tablet by mouth every 12 (twelve) hours.  05/12/24  Yes Karely Hurtado K, PA-C  ibuprofen  (ADVIL ) 800 MG tablet Take 1 tablet (800 mg total) by mouth every 8 (eight) hours as needed (pain). 05/10/24  Yes Maryruth Sol, FNP  sulfamethoxazole-trimethoprim  (BACTRIM DS) 800-160 MG tablet Take 1 tablet by mouth 2 (two) times daily for 7 days. 05/10/24 05/17/24 Yes Maryruth Sol, FNP    Family History Family History  Problem Relation Age of Onset   Migraines Mother    Depression Mother    Hypertension Mother    Alcohol abuse Father    Hypertension Father    Cancer Maternal Grandmother    Migraines Sister    Migraines Brother     Social History Social History   Tobacco Use   Smoking status: Never   Smokeless tobacco: Never  Vaping Use   Vaping status: Former   Substances: Nicotine, Flavoring  Substance Use Topics   Alcohol use: No   Drug use: No     Allergies   Patient has no known allergies.   Review of Systems Review of Systems  Constitutional:  Positive for activity change. Negative for appetite change, fatigue and fever.  Gastrointestinal:  Negative for abdominal pain, diarrhea, nausea and vomiting.  Musculoskeletal:  Negative for arthralgias and myalgias.  Skin:  Positive for color change and wound.  Neurological:  Negative for dizziness, light-headedness and headaches.  Physical Exam Triage Vital Signs ED Triage Vitals  Encounter Vitals Group     BP 05/12/24 1322 137/86     Systolic BP Percentile --      Diastolic BP Percentile --      Pulse Rate 05/12/24 1322 74     Resp 05/12/24 1322 19     Temp 05/12/24 1322 98.1 F (36.7 C)     Temp Source 05/12/24 1322 Oral     SpO2 05/12/24 1322 96 %     Weight 05/12/24 1321 270 lb (122.5 kg)     Height 05/12/24 1321 5\' 11"  (1.803 m)     Head Circumference --      Peak Flow --      Pain Score 05/12/24 1321 4     Pain Loc --      Pain Education --      Exclude from Growth Chart --    No data found.  Updated Vital Signs BP 137/86 (BP Location:  Left Arm)   Pulse 74   Temp 98.1 F (36.7 C) (Oral)   Resp 19   Ht 5\' 11"  (1.803 m)   Wt 270 lb (122.5 kg)   SpO2 96%   BMI 37.66 kg/m   Visual Acuity Right Eye Distance:   Left Eye Distance:   Bilateral Distance:    Right Eye Near:   Left Eye Near:    Bilateral Near:     Physical Exam Vitals reviewed.  Constitutional:      General: He is awake.     Appearance: Normal appearance. He is well-developed. He is not ill-appearing.     Comments: Very pleasant male appears stated age in no acute distress  HENT:     Head: Normocephalic and atraumatic.     Mouth/Throat:     Pharynx: Uvula midline. No oropharyngeal exudate or posterior oropharyngeal erythema.  Cardiovascular:     Rate and Rhythm: Normal rate and regular rhythm.     Heart sounds: Normal heart sounds, S1 normal and S2 normal. No murmur heard. Pulmonary:     Effort: Pulmonary effort is normal.     Breath sounds: Normal breath sounds. No stridor. No wheezing, rhonchi or rales.     Comments: Clear to auscultation bilaterally Skin:    Findings: Abscess present.       Neurological:     Mental Status: He is alert.  Psychiatric:        Behavior: Behavior is cooperative.      UC Treatments / Results  Labs (all labs ordered are listed, but only abnormal results are displayed) Labs Reviewed - No data to display  EKG   Radiology No results found.  Procedures Incision and Drainage  Date/Time: 05/12/2024 2:13 PM  Performed by: Budd Cargo, PA-C Authorized by: Budd Cargo, PA-C   Consent:    Consent obtained:  Verbal   Consent given by:  Patient   Risks discussed:  Bleeding, incomplete drainage, infection and pain   Alternatives discussed:  No treatment Universal protocol:    Procedure explained and questions answered to patient or proxy's satisfaction: yes     Patient identity confirmed:  Verbally with patient Location:    Type:  Pilonidal cyst   Size:  3 cm x 2 cm   Location:  Anogenital    Anogenital location:  Pilonidal Pre-procedure details:    Skin preparation:  Chlorhexidine with alcohol Sedation:    Sedation type:  None Anesthesia:    Anesthesia method:  Local infiltration   Local anesthetic:  Lidocaine  1% w/o epi Procedure type:    Complexity:  Complex Procedure details:    Ultrasound guidance: no     Needle aspiration: no     Incision types:  Stab incision   Incision depth:  Dermal   Wound management:  Probed and deloculated and irrigated with saline   Drainage:  Bloody and purulent   Drainage amount:  Moderate   Wound treatment:  Wound left open   Packing materials:  None Post-procedure details:    Procedure completion:  Tolerated well, no immediate complications  (including critical care time)  Medications Ordered in UC Medications - No data to display  Initial Impression / Assessment and Plan / UC Course  I have reviewed the triage vital signs and the nursing notes.  Pertinent labs & imaging results that were available during my care of the patient were reviewed by me and considered in my medical decision making (see chart for details).     Patient is well-appearing, afebrile, nontoxic tachycardic.  He had significant fluctuance without active drainage on exam and so I&D was performed with significant improvement of symptoms and resolution of pain.  See procedure note above.  He will continue Bactrim  DS as previously prescribed and add Augmentin  twice daily for 7 days.  Recommend he continue with warm compresses.  He can use over-the-counter medication for pain relief.  We discussed that this is likely to recur and so he should follow-up with his surgeon for further evaluation and management.  He was given the contact information for local provider with instruction to call to schedule an appointment.  Discussed that if anything worsens and he has increasing pain, swelling, fever, nausea/vomiting, change in character of drainage she should be seen  immediately.  Strict return precautions given.  Excuse note provided.  Final Clinical Impressions(s) / UC Diagnoses   Final diagnoses:  Pilonidal cyst with abscess     Discharge Instructions      We were able to drain the abscess today.  Continue with your warm compresses.  Keep this covered as it will continue to drain.  As we discussed, this can come back so I do recommend following up with a surgeon.  Call them to schedule an appointment.  Continue the Bactrim  DS that you are prescribed and add Augmentin  twice daily.  If anything changes and you have increasing drainage, fever, increasing pain, nausea, vomiting you need to be seen immediately.   ED Prescriptions     Medication Sig Dispense Auth. Provider   amoxicillin -clavulanate (AUGMENTIN ) 875-125 MG tablet Take 1 tablet by mouth every 12 (twelve) hours. 14 tablet Breydon Senters K, PA-C      PDMP not reviewed this encounter.   Budd Cargo, PA-C 05/12/24 1414
